# Patient Record
Sex: Male | Born: 1940 | Race: White | Hispanic: No | Marital: Married
Health system: Southern US, Community
[De-identification: ages and names within clinical notes are randomized; demographics above are authoritative.]

---

## 1997-12-25 ENCOUNTER — Emergency Department (HOSPITAL_COMMUNITY): Admission: EM | Admit: 1997-12-25 | Discharge: 1997-12-25 | Payer: Self-pay | Admitting: Emergency Medicine

## 1997-12-27 ENCOUNTER — Inpatient Hospital Stay (HOSPITAL_COMMUNITY): Admission: EM | Admit: 1997-12-27 | Discharge: 1997-12-28 | Payer: Self-pay | Admitting: Emergency Medicine

## 1998-06-15 ENCOUNTER — Inpatient Hospital Stay (HOSPITAL_COMMUNITY): Admission: AD | Admit: 1998-06-15 | Discharge: 1998-06-23 | Payer: Self-pay | Admitting: *Deleted

## 1998-06-16 ENCOUNTER — Encounter: Payer: Self-pay | Admitting: *Deleted

## 1998-06-19 ENCOUNTER — Encounter: Payer: Self-pay | Admitting: *Deleted

## 1998-06-20 ENCOUNTER — Encounter: Payer: Self-pay | Admitting: *Deleted

## 1998-07-07 ENCOUNTER — Inpatient Hospital Stay (HOSPITAL_COMMUNITY): Admission: EM | Admit: 1998-07-07 | Discharge: 1998-07-09 | Payer: Self-pay | Admitting: *Deleted

## 1998-07-07 ENCOUNTER — Encounter: Payer: Self-pay | Admitting: *Deleted

## 1998-09-10 ENCOUNTER — Inpatient Hospital Stay (HOSPITAL_COMMUNITY): Admission: EM | Admit: 1998-09-10 | Discharge: 1998-09-19 | Payer: Self-pay | Admitting: Emergency Medicine

## 1998-09-11 ENCOUNTER — Encounter: Payer: Self-pay | Admitting: Emergency Medicine

## 1998-12-22 ENCOUNTER — Inpatient Hospital Stay (HOSPITAL_COMMUNITY): Admission: AD | Admit: 1998-12-22 | Discharge: 1998-12-23 | Payer: Self-pay | Admitting: *Deleted

## 2000-11-22 ENCOUNTER — Encounter: Payer: Self-pay | Admitting: Emergency Medicine

## 2000-11-22 ENCOUNTER — Inpatient Hospital Stay (HOSPITAL_COMMUNITY): Admission: EM | Admit: 2000-11-22 | Discharge: 2000-11-25 | Payer: Self-pay | Admitting: Emergency Medicine

## 2000-12-03 ENCOUNTER — Inpatient Hospital Stay (HOSPITAL_COMMUNITY): Admission: EM | Admit: 2000-12-03 | Discharge: 2000-12-17 | Payer: Self-pay | Admitting: Emergency Medicine

## 2000-12-03 ENCOUNTER — Encounter: Payer: Self-pay | Admitting: *Deleted

## 2000-12-05 ENCOUNTER — Encounter: Payer: Self-pay | Admitting: *Deleted

## 2000-12-06 ENCOUNTER — Encounter: Payer: Self-pay | Admitting: *Deleted

## 2000-12-09 ENCOUNTER — Encounter: Payer: Self-pay | Admitting: *Deleted

## 2000-12-11 ENCOUNTER — Encounter: Payer: Self-pay | Admitting: *Deleted

## 2000-12-16 ENCOUNTER — Encounter: Payer: Self-pay | Admitting: *Deleted

## 2000-12-19 ENCOUNTER — Ambulatory Visit (HOSPITAL_COMMUNITY): Admission: RE | Admit: 2000-12-19 | Discharge: 2000-12-19 | Payer: Self-pay | Admitting: Nephrology

## 2000-12-23 ENCOUNTER — Encounter (HOSPITAL_COMMUNITY): Admission: RE | Admit: 2000-12-23 | Discharge: 2001-02-12 | Payer: Self-pay | Admitting: Nephrology

## 2001-02-13 ENCOUNTER — Encounter (HOSPITAL_COMMUNITY): Admission: RE | Admit: 2001-02-13 | Discharge: 2001-05-14 | Payer: Self-pay | Admitting: Nephrology

## 2001-05-04 ENCOUNTER — Inpatient Hospital Stay (HOSPITAL_COMMUNITY): Admission: EM | Admit: 2001-05-04 | Discharge: 2001-05-08 | Payer: Self-pay

## 2001-08-13 ENCOUNTER — Ambulatory Visit (HOSPITAL_COMMUNITY): Admission: RE | Admit: 2001-08-13 | Discharge: 2001-08-14 | Payer: Self-pay | Admitting: Internal Medicine

## 2001-08-13 ENCOUNTER — Encounter: Payer: Self-pay | Admitting: Internal Medicine

## 2001-08-14 ENCOUNTER — Encounter: Payer: Self-pay | Admitting: *Deleted

## 2002-02-18 ENCOUNTER — Inpatient Hospital Stay (HOSPITAL_COMMUNITY): Admission: AD | Admit: 2002-02-18 | Discharge: 2002-02-21 | Payer: Self-pay | Admitting: *Deleted

## 2002-02-19 ENCOUNTER — Encounter: Payer: Self-pay | Admitting: Cardiology

## 2002-03-03 ENCOUNTER — Ambulatory Visit (HOSPITAL_COMMUNITY): Admission: RE | Admit: 2002-03-03 | Discharge: 2002-03-03 | Payer: Self-pay | Admitting: *Deleted

## 2002-03-03 ENCOUNTER — Encounter: Payer: Self-pay | Admitting: *Deleted

## 2002-03-30 ENCOUNTER — Encounter: Payer: Self-pay | Admitting: Nephrology

## 2002-03-30 ENCOUNTER — Inpatient Hospital Stay (HOSPITAL_COMMUNITY): Admission: AD | Admit: 2002-03-30 | Discharge: 2002-04-23 | Payer: Self-pay | Admitting: Nephrology

## 2002-03-31 ENCOUNTER — Encounter: Payer: Self-pay | Admitting: Nephrology

## 2002-04-01 ENCOUNTER — Encounter: Payer: Self-pay | Admitting: Nephrology

## 2002-04-03 ENCOUNTER — Encounter: Payer: Self-pay | Admitting: Nephrology

## 2002-04-06 ENCOUNTER — Encounter: Payer: Self-pay | Admitting: Nephrology

## 2002-04-07 ENCOUNTER — Encounter: Payer: Self-pay | Admitting: Nephrology

## 2002-04-12 ENCOUNTER — Encounter: Payer: Self-pay | Admitting: Nephrology

## 2002-04-21 ENCOUNTER — Encounter: Payer: Self-pay | Admitting: Nephrology

## 2002-05-06 ENCOUNTER — Encounter: Payer: Self-pay | Admitting: Nephrology

## 2002-05-06 ENCOUNTER — Encounter: Admission: RE | Admit: 2002-05-06 | Discharge: 2002-05-06 | Payer: Self-pay | Admitting: Nephrology

## 2002-06-18 ENCOUNTER — Encounter: Admission: RE | Admit: 2002-06-18 | Discharge: 2002-09-16 | Payer: Self-pay | Admitting: Nephrology

## 2002-06-23 ENCOUNTER — Encounter: Payer: Self-pay | Admitting: Nephrology

## 2002-06-23 ENCOUNTER — Inpatient Hospital Stay (HOSPITAL_COMMUNITY): Admission: AD | Admit: 2002-06-23 | Discharge: 2002-06-25 | Payer: Self-pay | Admitting: Nephrology

## 2002-07-29 ENCOUNTER — Ambulatory Visit (HOSPITAL_COMMUNITY): Admission: RE | Admit: 2002-07-29 | Discharge: 2002-07-29 | Payer: Self-pay | Admitting: *Deleted

## 2002-07-29 ENCOUNTER — Encounter: Payer: Self-pay | Admitting: *Deleted

## 2002-08-19 ENCOUNTER — Encounter: Payer: Self-pay | Admitting: Endocrinology

## 2002-08-19 ENCOUNTER — Encounter: Admission: RE | Admit: 2002-08-19 | Discharge: 2002-08-19 | Payer: Self-pay | Admitting: Endocrinology

## 2002-10-21 ENCOUNTER — Ambulatory Visit (HOSPITAL_COMMUNITY): Admission: RE | Admit: 2002-10-21 | Discharge: 2002-10-21 | Payer: Self-pay | Admitting: Nephrology

## 2002-10-21 ENCOUNTER — Encounter: Payer: Self-pay | Admitting: Nephrology

## 2002-11-04 ENCOUNTER — Ambulatory Visit (HOSPITAL_COMMUNITY): Admission: RE | Admit: 2002-11-04 | Discharge: 2002-11-04 | Payer: Self-pay | Admitting: Nephrology

## 2002-12-10 ENCOUNTER — Ambulatory Visit (HOSPITAL_COMMUNITY): Admission: RE | Admit: 2002-12-10 | Discharge: 2002-12-10 | Payer: Self-pay | Admitting: Vascular Surgery

## 2002-12-11 ENCOUNTER — Encounter: Payer: Self-pay | Admitting: Nephrology

## 2002-12-11 ENCOUNTER — Ambulatory Visit (HOSPITAL_COMMUNITY): Admission: RE | Admit: 2002-12-11 | Discharge: 2002-12-11 | Payer: Self-pay | Admitting: Nephrology

## 2003-01-14 ENCOUNTER — Ambulatory Visit (HOSPITAL_COMMUNITY): Admission: RE | Admit: 2003-01-14 | Discharge: 2003-01-14 | Payer: Self-pay | Admitting: Nephrology

## 2003-01-21 ENCOUNTER — Encounter: Payer: Self-pay | Admitting: Nephrology

## 2003-01-21 ENCOUNTER — Ambulatory Visit (HOSPITAL_COMMUNITY): Admission: RE | Admit: 2003-01-21 | Discharge: 2003-01-21 | Payer: Self-pay | Admitting: Nephrology

## 2003-02-01 ENCOUNTER — Encounter: Payer: Self-pay | Admitting: Nephrology

## 2003-02-01 ENCOUNTER — Ambulatory Visit (HOSPITAL_COMMUNITY): Admission: RE | Admit: 2003-02-01 | Discharge: 2003-02-01 | Payer: Self-pay | Admitting: Diagnostic Radiology

## 2003-02-09 ENCOUNTER — Encounter: Payer: Self-pay | Admitting: Vascular Surgery

## 2003-02-09 ENCOUNTER — Ambulatory Visit (HOSPITAL_COMMUNITY): Admission: RE | Admit: 2003-02-09 | Discharge: 2003-02-09 | Payer: Self-pay | Admitting: Vascular Surgery

## 2003-02-10 ENCOUNTER — Inpatient Hospital Stay (HOSPITAL_COMMUNITY): Admission: EM | Admit: 2003-02-10 | Discharge: 2003-02-11 | Payer: Self-pay

## 2003-02-11 ENCOUNTER — Encounter: Payer: Self-pay | Admitting: Nephrology

## 2003-02-14 ENCOUNTER — Encounter: Payer: Self-pay | Admitting: Emergency Medicine

## 2003-02-14 ENCOUNTER — Inpatient Hospital Stay (HOSPITAL_COMMUNITY): Admission: EM | Admit: 2003-02-14 | Discharge: 2003-02-17 | Payer: Self-pay | Admitting: Emergency Medicine

## 2003-02-16 ENCOUNTER — Encounter: Payer: Self-pay | Admitting: Neurology

## 2003-06-22 ENCOUNTER — Encounter: Admission: RE | Admit: 2003-06-22 | Discharge: 2003-06-22 | Payer: Self-pay | Admitting: Nephrology

## 2003-08-24 ENCOUNTER — Encounter: Admission: RE | Admit: 2003-08-24 | Discharge: 2003-08-24 | Payer: Self-pay | Admitting: Nephrology

## 2003-09-06 ENCOUNTER — Inpatient Hospital Stay (HOSPITAL_COMMUNITY): Admission: EM | Admit: 2003-09-06 | Discharge: 2003-11-05 | Payer: Self-pay | Admitting: *Deleted

## 2005-08-25 IMAGING — CT CT HEAD W/O CM
1 of 2 series · 13 of 30 positions shown, 17 images · non-contrast
Comparison: none

CLINICAL DATA: Syncope, renal failure.  Question CVA.  
 CT HEAD WITHOUT CONTRAST, 09/16/03
TECHNIQUE: Multiple contiguous axial images obtained from the skull base through the vertex.

[Series 2: brain · axial · 0.47mm/px · z∈[+156,+293]mm · 13 of 32 slices shown, 17 images]
[im 3/32  brain]
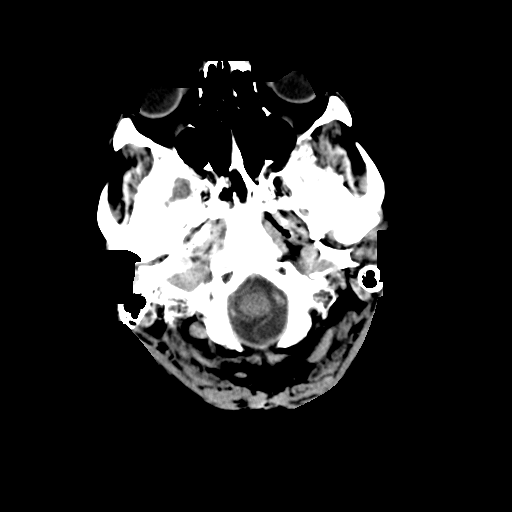
[im 3/32  bone]
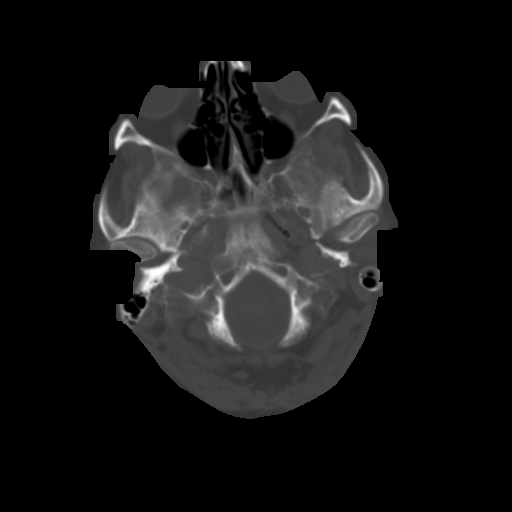
[im 5/32  brain]
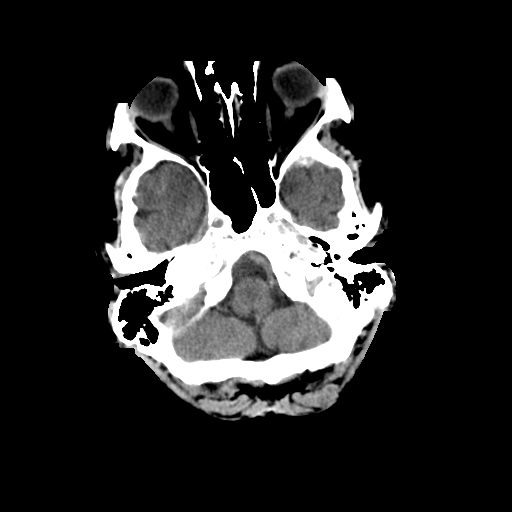
[im 7/32  brain]
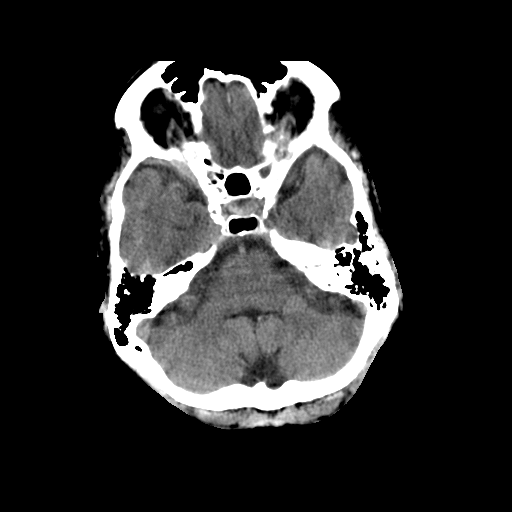
[im 9/32  brain]
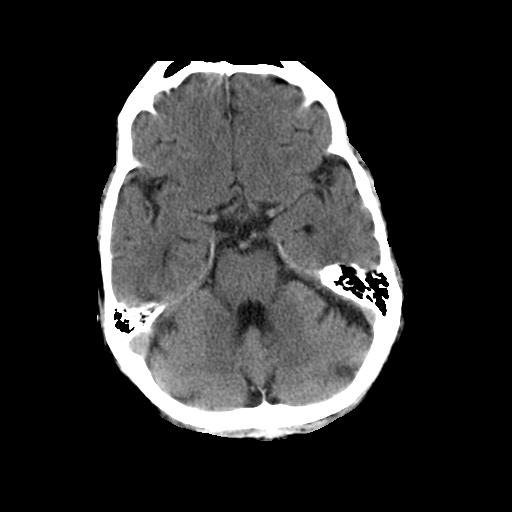
[im 12/32  brain]
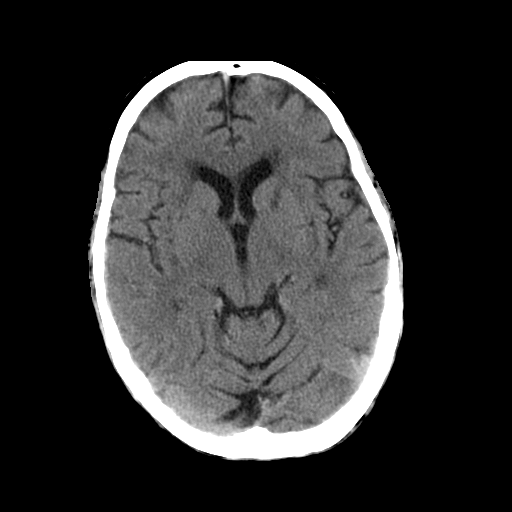
[im 12/32  bone]
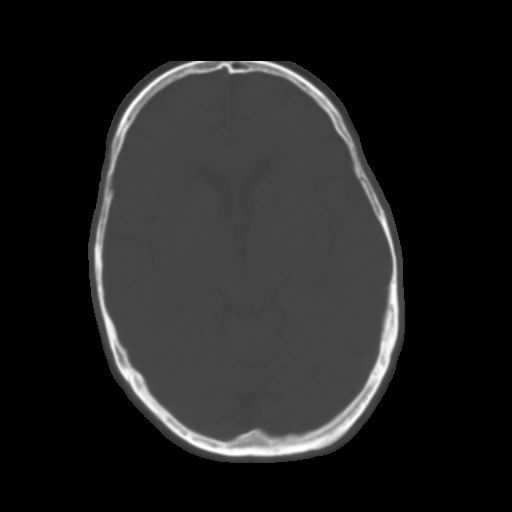
[im 14/32  brain]
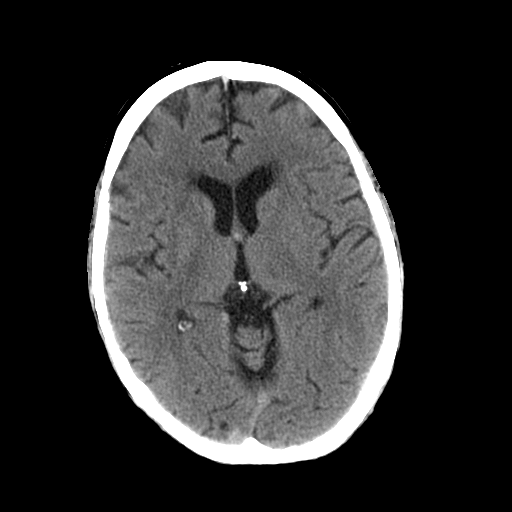
[im 16/32  brain]
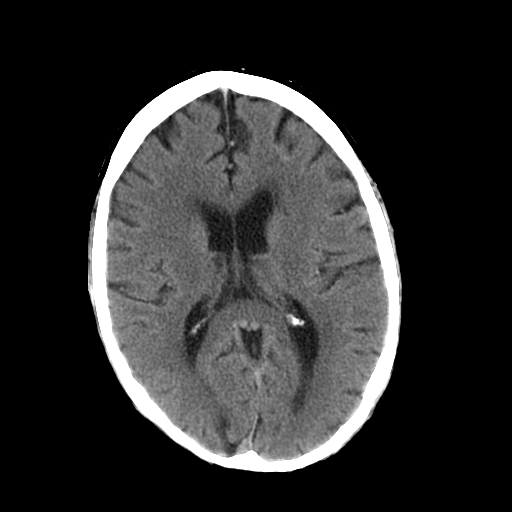
[im 18/32  brain]
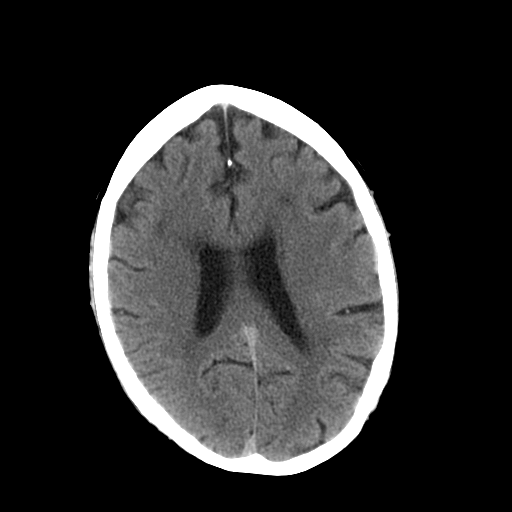
[im 20/32  brain]
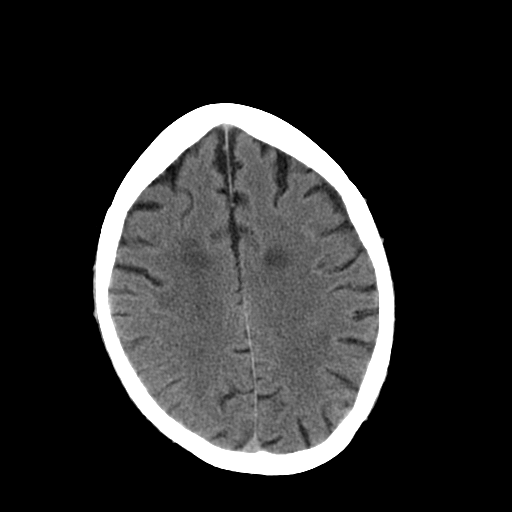
[im 20/32  bone]
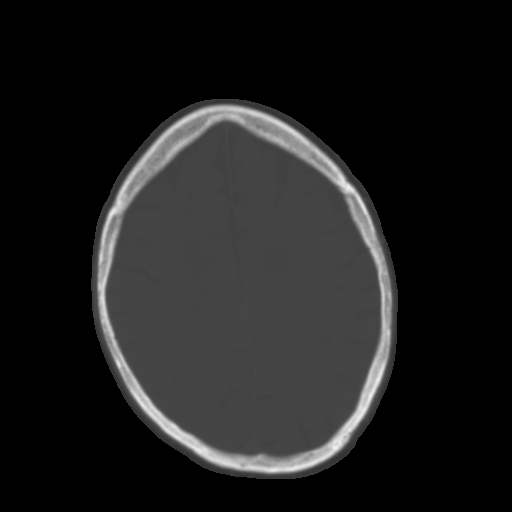
[im 23/32  brain]
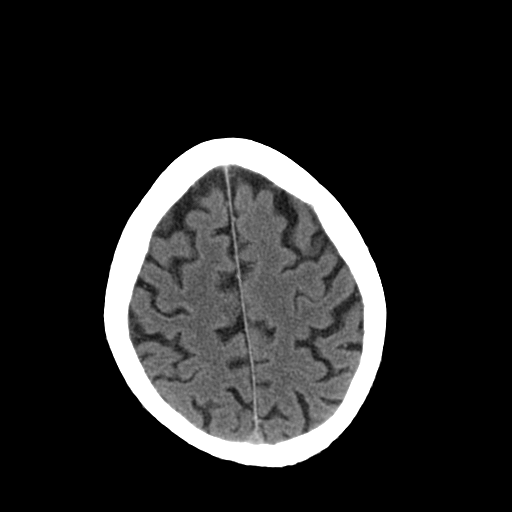
[im 25/32  brain]
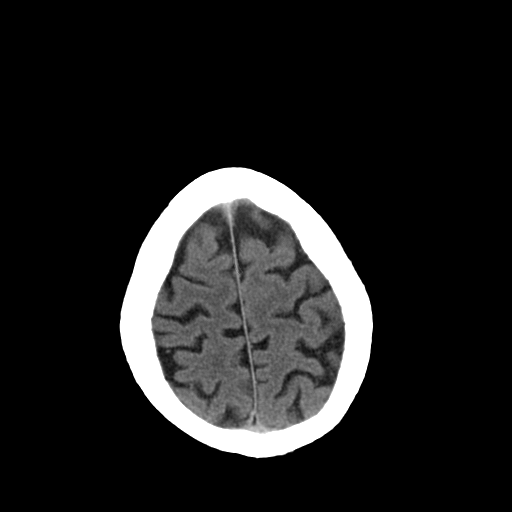
[im 27/32  brain]
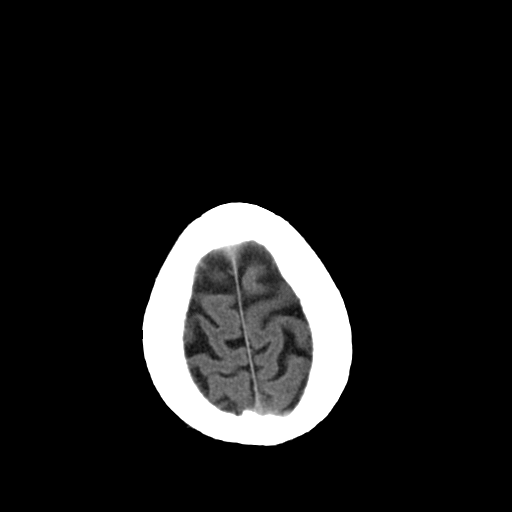
[im 29/32  brain]
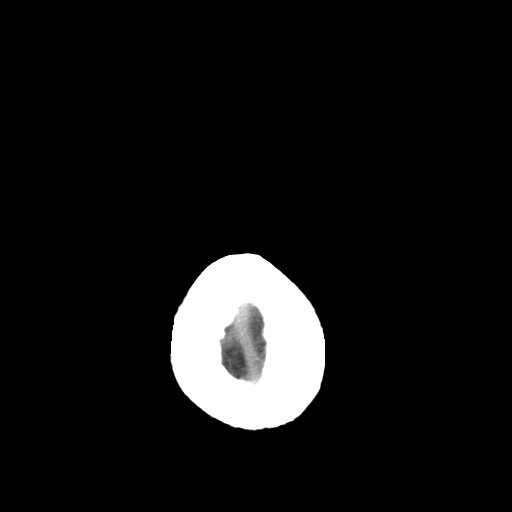
[im 29/32  bone]
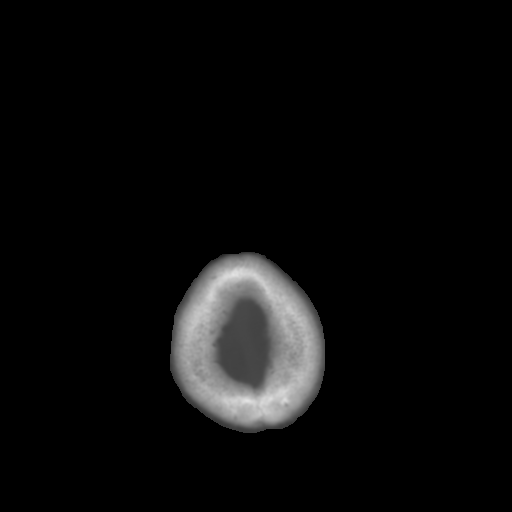

[13 of 30 positions shown; findings below may reference images not displayed]

FINDINGS: Comparison to 09/06/03.
 Mild cerebral and cerebellar atrophy and small to moderate chronic small vessel white matter disease noted.  Remote infarcts in the right occipital lobe and left internal capsule are noted.  No definite acute intracranial abnormality including mass or mass effect, hydrocephalus, extra-axial fluid collection, midline shift, hemorrhage, acute infarct.  Acute infarct may be missed by CT for 24-48 hours.  Visualized bony calvarium and paranasal sinuses are unremarkable.  
 IMPRESSION
 No acute intracranial abnormality.
 Remote atrophy, small vessel disease and remote infarcts.

## 2005-08-25 IMAGING — CR DG TIBIA/FIBULA 2V*R*
4 series · 4 of 4 positions shown · non-contrast
Comparison: 09/12/03.

CLINICAL DATA: Tibial fracture.  Cast placement.
RIGHT TIBIA AND FIBULA ? 09/16/03

[view not recorded (1 of 4)]
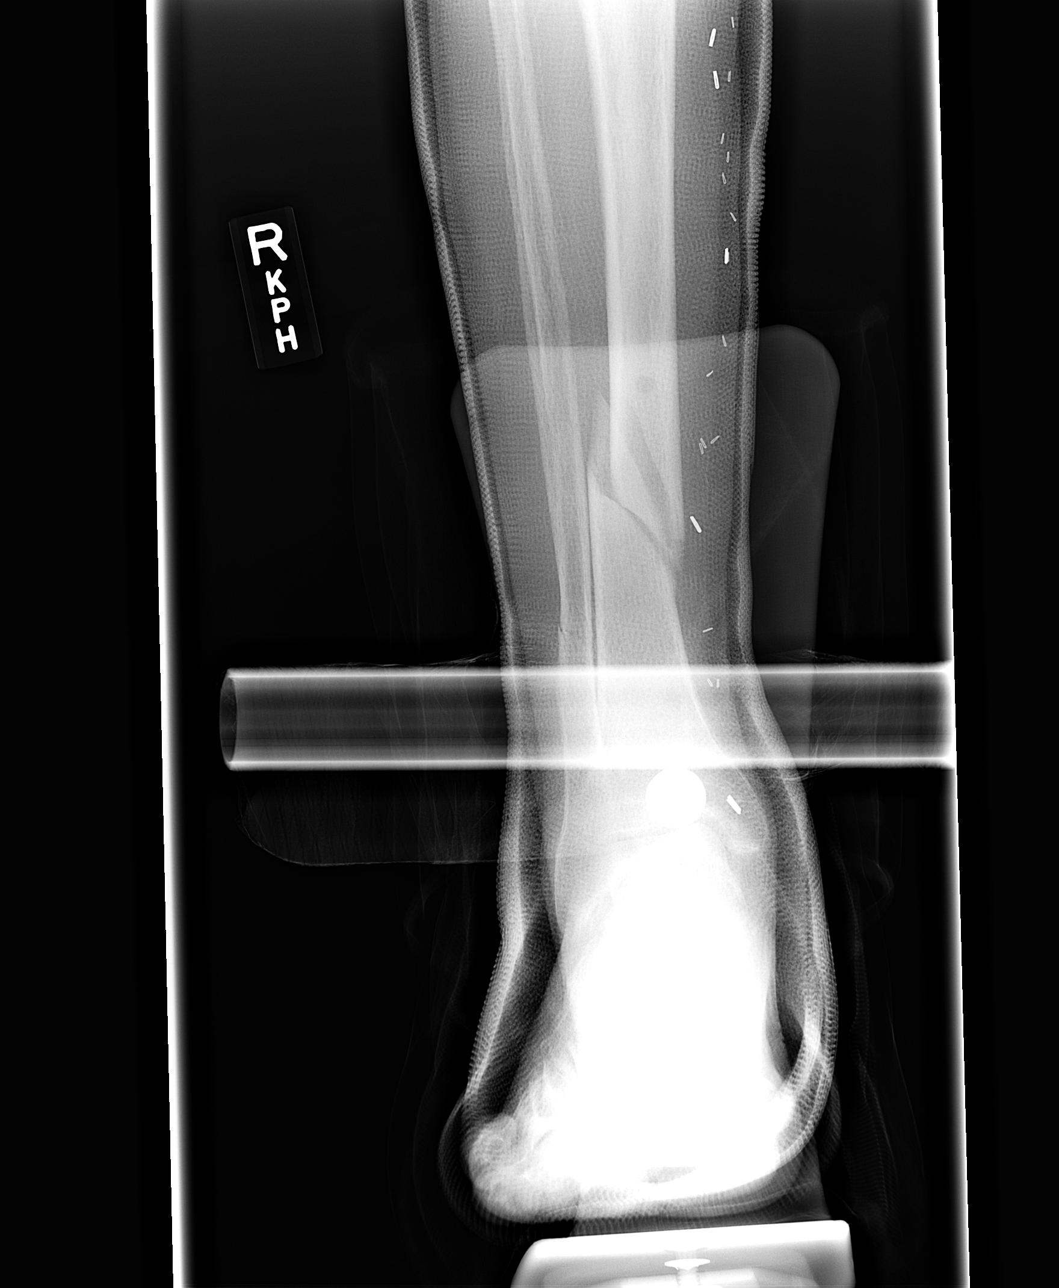

[view not recorded (2 of 4)]
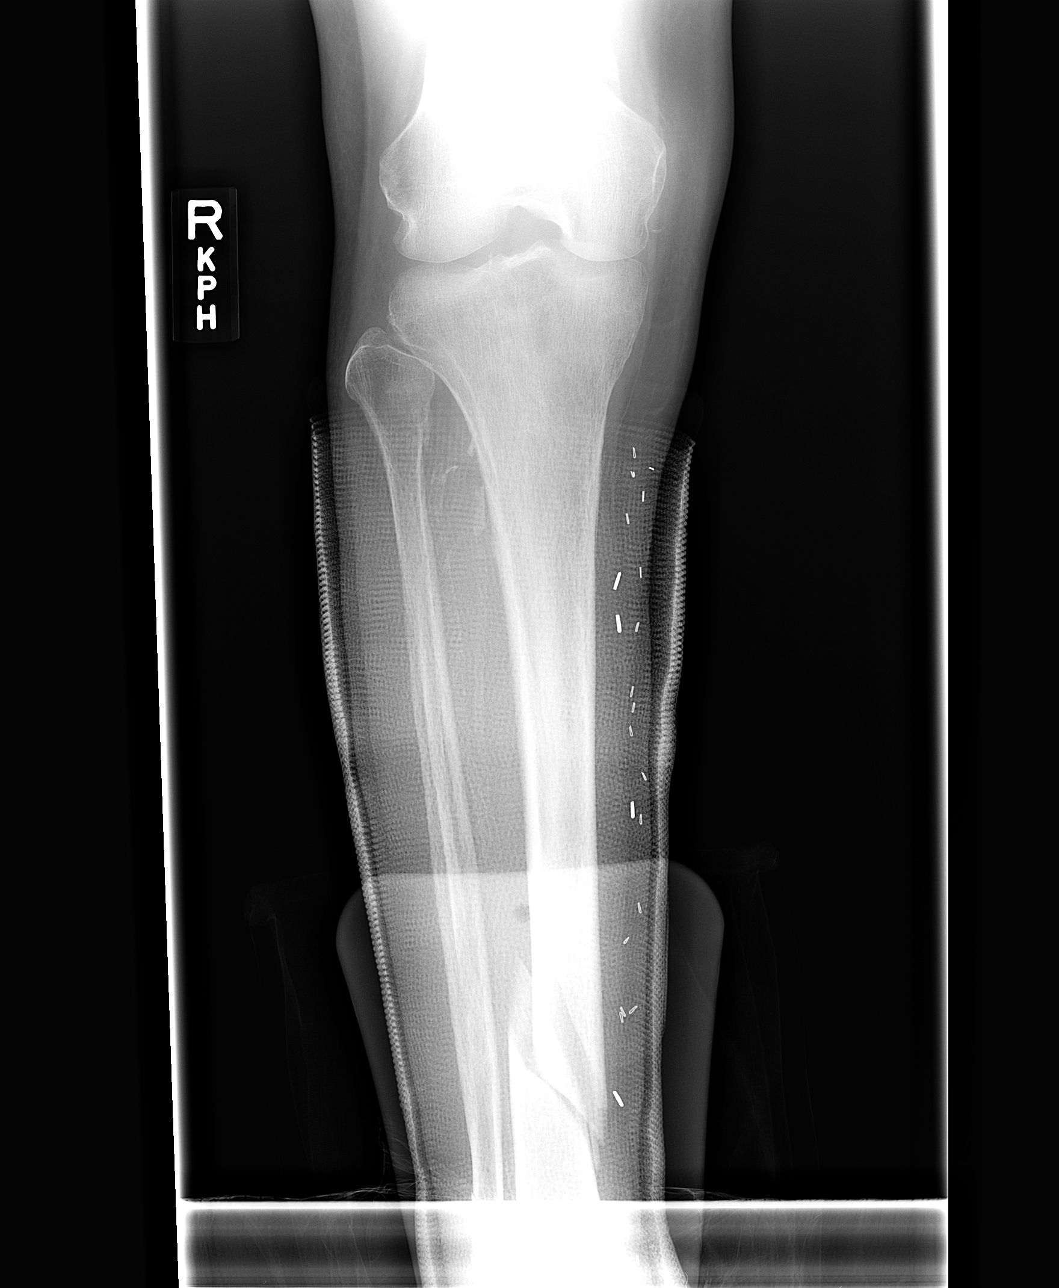

[view not recorded (3 of 4)]
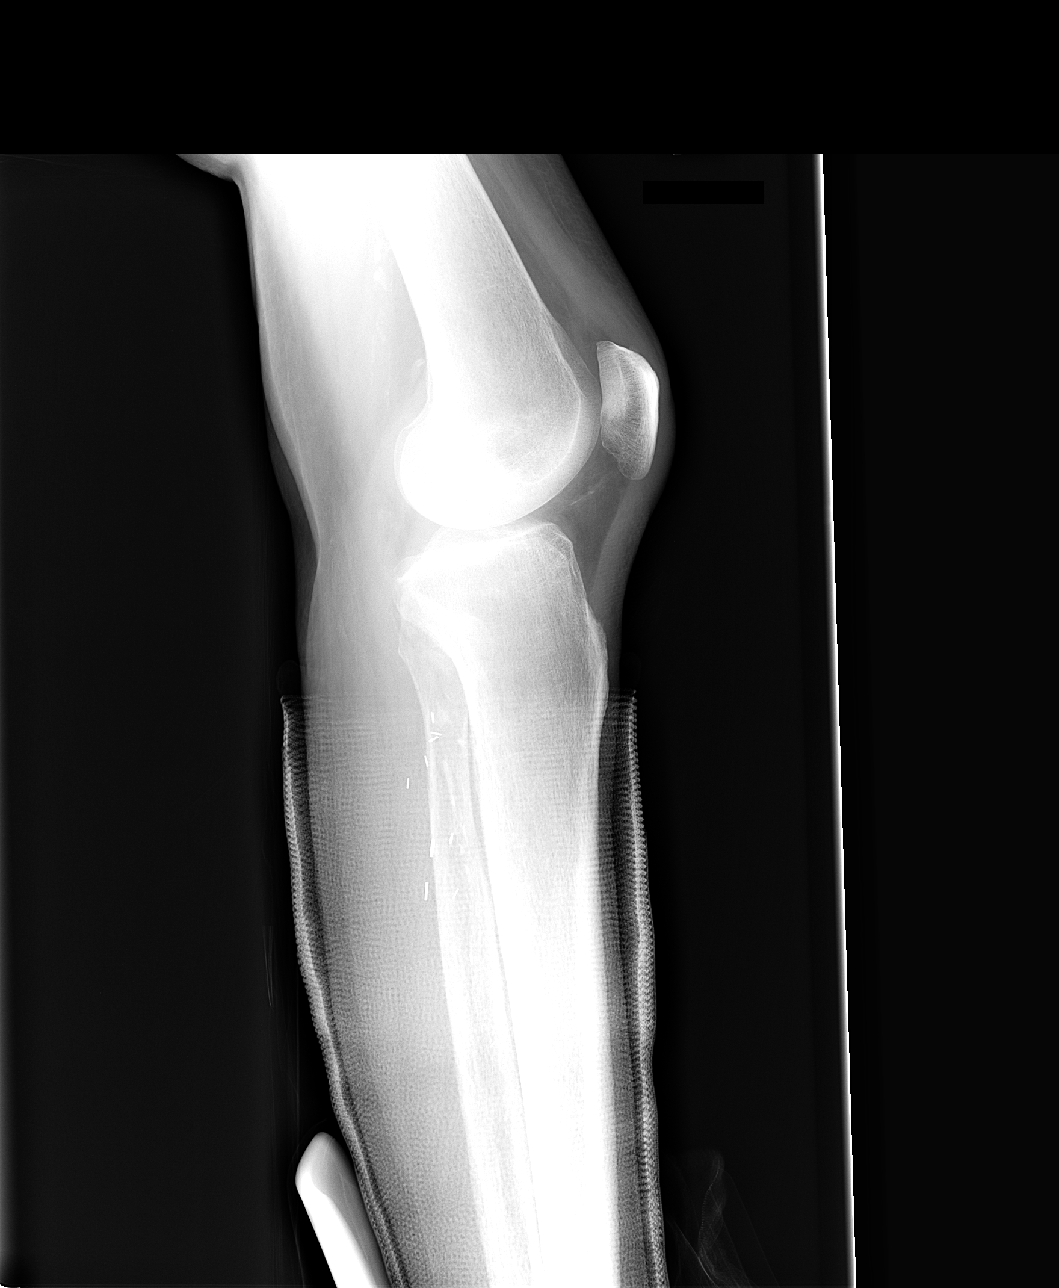

[view not recorded (4 of 4)]
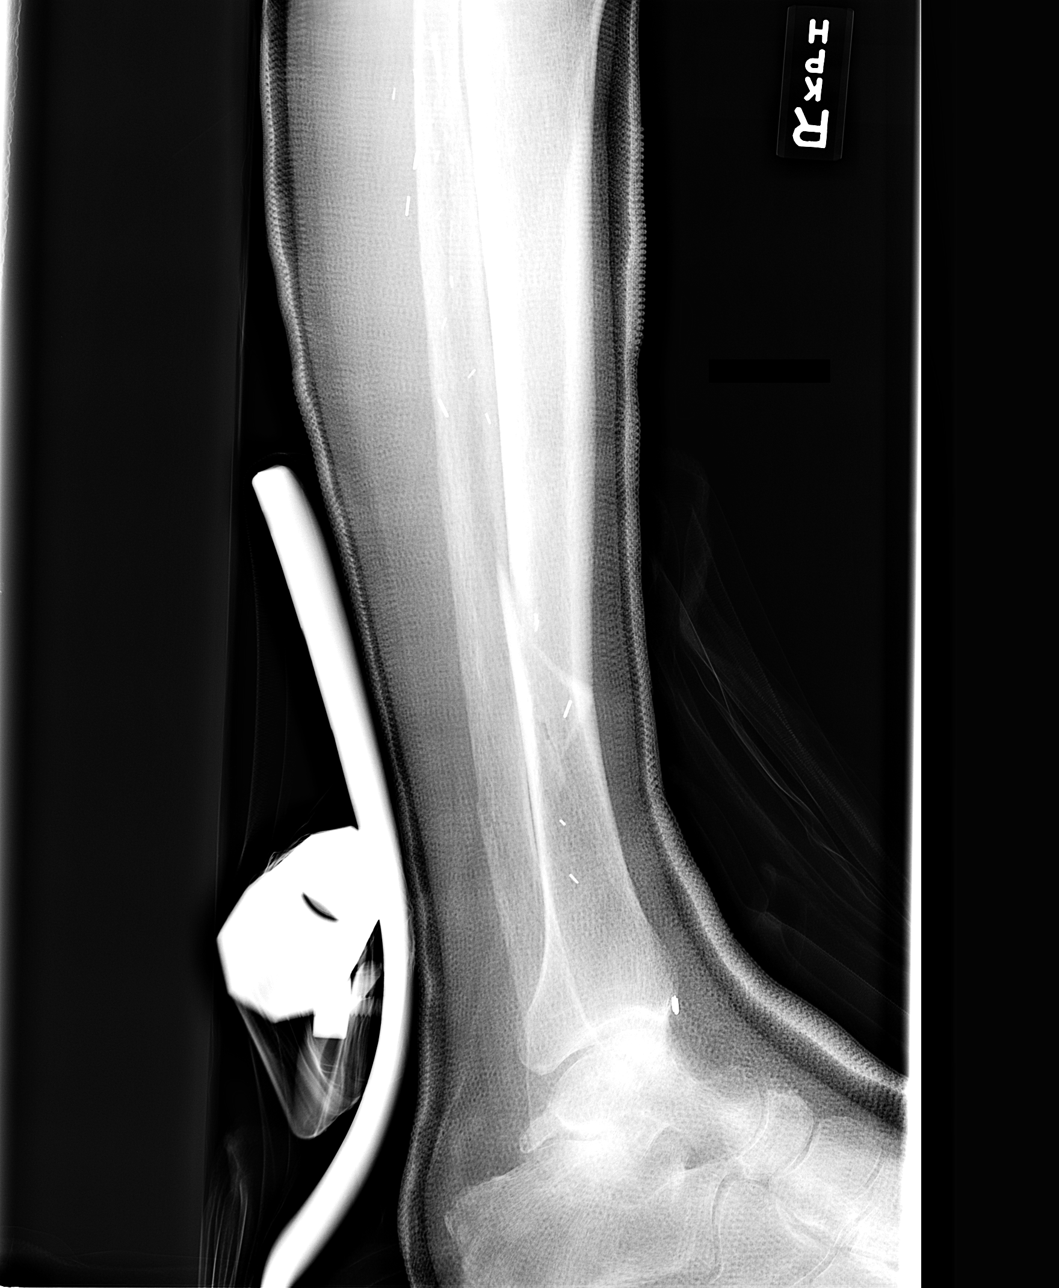

[4 of 4 positions shown; findings below may reference images not displayed]

FINDINGS: Fiberglass cast overlies the tibia and fibula.  There is increased medial displacement of the distal tibial fracture site, now approximately 7 mm.  4-mm posterior displacement is relatively stable.  Fibular fracture is unchanged.
IMPRESSION: Slight increased displacement of tibial fracture with cast placement as described.

## 2014-05-21 ENCOUNTER — Encounter: Payer: Self-pay | Admitting: Gastroenterology

## 2020-04-12 ENCOUNTER — Telehealth: Payer: Self-pay | Admitting: *Deleted

## 2020-04-12 NOTE — Telephone Encounter (Signed)
Error
